# Patient Record
Sex: Male | Born: 1995 | Race: Black or African American | Hispanic: No | Marital: Single | Smoking: Never smoker
Health system: Southern US, Community
[De-identification: ages and names within clinical notes are randomized; demographics above are authoritative.]

---
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
## 2015-11-08 ENCOUNTER — Encounter (HOSPITAL_COMMUNITY): Payer: Self-pay | Admitting: Emergency Medicine

## 2015-11-08 DIAGNOSIS — J039 Acute tonsillitis, unspecified: Principal | ICD-10-CM | POA: Diagnosis present

## 2015-11-08 NOTE — ED Notes (Signed)
Patient with URI and sore throat.  Patient states that he was prescribed Penicillen and viscous lidocaine for his sore throat.  He states that he has not been able to sleep, having a hard time breathing due to the congestion.

## 2015-11-09 ENCOUNTER — Emergency Department (HOSPITAL_COMMUNITY): Payer: Commercial Managed Care - PPO

## 2015-11-09 ENCOUNTER — Encounter (HOSPITAL_COMMUNITY): Payer: Self-pay | Admitting: Radiology

## 2015-11-09 ENCOUNTER — Inpatient Hospital Stay (HOSPITAL_COMMUNITY)
Admission: EM | Admit: 2015-11-09 | Discharge: 2015-11-10 | DRG: 153 | Disposition: A | Discharge status: Home / Self Care | Payer: Commercial Managed Care - PPO | Attending: Internal Medicine | Admitting: Internal Medicine

## 2015-11-09 DIAGNOSIS — J029 Acute pharyngitis, unspecified: Secondary | ICD-10-CM

## 2015-11-09 DIAGNOSIS — J3502 Chronic adenoiditis: Secondary | ICD-10-CM

## 2015-11-09 DIAGNOSIS — J039 Acute tonsillitis, unspecified: Secondary | ICD-10-CM | POA: Diagnosis present

## 2015-11-09 LAB — CBC WITH DIFFERENTIAL/PLATELET
BASOS ABS: 0.3 10*3/uL — AB (ref 0.0–0.1)
BASOS PCT: 3 %
EOS PCT: 0 %
Eosinophils Absolute: 0 10*3/uL (ref 0.0–0.7)
HEMATOCRIT: 41.6 % (ref 39.0–52.0)
HEMOGLOBIN: 13.8 g/dL (ref 13.0–17.0)
LYMPHS PCT: 63 %
Lymphs Abs: 7.3 10*3/uL — ABNORMAL HIGH (ref 0.7–4.0)
MCH: 26.1 pg (ref 26.0–34.0)
MCHC: 33.2 g/dL (ref 30.0–36.0)
MCV: 78.8 fL (ref 78.0–100.0)
MONOS PCT: 11 %
Monocytes Absolute: 1.3 10*3/uL — ABNORMAL HIGH (ref 0.1–1.0)
NEUTROS ABS: 2.7 10*3/uL (ref 1.7–7.7)
Neutrophils Relative %: 23 %
Platelets: 155 10*3/uL (ref 150–400)
RBC: 5.28 MIL/uL (ref 4.22–5.81)
RDW: 13.4 % (ref 11.5–15.5)
WBC: 11.6 10*3/uL — ABNORMAL HIGH (ref 4.0–10.5)

## 2015-11-09 LAB — I-STAT CHEM 8, ED
BUN: 25 mg/dL — AB (ref 6–20)
CALCIUM ION: 1.09 mmol/L — AB (ref 1.12–1.23)
CHLORIDE: 99 mmol/L — AB (ref 101–111)
CREATININE: 0.7 mg/dL (ref 0.61–1.24)
Glucose, Bld: 110 mg/dL — ABNORMAL HIGH (ref 65–99)
HCT: 47 % (ref 39.0–52.0)
Hemoglobin: 16 g/dL (ref 13.0–17.0)
Potassium: 4.6 mmol/L (ref 3.5–5.1)
SODIUM: 135 mmol/L (ref 135–145)
TCO2: 28 mmol/L (ref 0–100)

## 2015-11-09 LAB — RAPID STREP SCREEN (MED CTR MEBANE ONLY): STREPTOCOCCUS, GROUP A SCREEN (DIRECT): NEGATIVE

## 2015-11-09 LAB — MONONUCLEOSIS SCREEN: MONO SCREEN: POSITIVE — AB

## 2015-11-09 MED ORDER — INFLUENZA VAC SPLIT QUAD 0.5 ML IM SUSY: 0.5000 mL | PREFILLED_SYRINGE | INTRAMUSCULAR | Status: DC | PRN

## 2015-11-09 MED ORDER — OXYCODONE HCL 5 MG PO TABS
5.0000 mg | ORAL_TABLET | Freq: Four times a day (QID) | ORAL | Status: DC | PRN
  Administered 2015-11-09 – 2015-11-10 (×3): 5 mg via ORAL
  Filled 2015-11-09 (×4): qty 1

## 2015-11-09 MED ORDER — BENZONATATE 100 MG PO CAPS
200.0000 mg | ORAL_CAPSULE | Freq: Once | ORAL | Status: AC
  Administered 2015-11-09: 200 mg via ORAL
  Filled 2015-11-09: qty 2

## 2015-11-09 MED ORDER — LIDOCAINE VISCOUS 2 % MT SOLN
20.0000 mL | OROMUCOSAL | Status: DC | PRN
  Administered 2015-11-09 – 2015-11-10 (×2): 20 mL via OROMUCOSAL
  Filled 2015-11-09 (×2): qty 30

## 2015-11-09 MED ORDER — ACETAMINOPHEN 325 MG PO TABS
650.0000 mg | ORAL_TABLET | Freq: Four times a day (QID) | ORAL | Status: DC | PRN
  Administered 2015-11-10: 650 mg via ORAL
  Filled 2015-11-09: qty 2

## 2015-11-09 MED ORDER — IPRATROPIUM-ALBUTEROL 0.5-2.5 (3) MG/3ML IN SOLN
3.0000 mL | RESPIRATORY_TRACT | Status: DC | PRN
  Administered 2015-11-09 – 2015-11-10 (×2): 3 mL via RESPIRATORY_TRACT
  Filled 2015-11-09 (×2): qty 3

## 2015-11-09 MED ORDER — ACETAMINOPHEN 650 MG RE SUPP: 650.0000 mg | Freq: Four times a day (QID) | RECTAL | Status: DC | PRN

## 2015-11-09 MED ORDER — KETOROLAC TROMETHAMINE 30 MG/ML IJ SOLN
30.0000 mg | Freq: Once | INTRAMUSCULAR | Status: AC
  Administered 2015-11-09: 30 mg via INTRAVENOUS
  Filled 2015-11-09: qty 1

## 2015-11-09 MED ORDER — SODIUM CHLORIDE 0.9 % IV SOLN
INTRAVENOUS | Status: DC
  Administered 2015-11-09: 06:00:00 via INTRAVENOUS

## 2015-11-09 MED ORDER — IOHEXOL 300 MG/ML  SOLN
75.0000 mL | Freq: Once | INTRAMUSCULAR | Status: AC | PRN
  Administered 2015-11-09: 50 mL via INTRAVENOUS

## 2015-11-09 MED ORDER — MORPHINE SULFATE (PF) 2 MG/ML IV SOLN: 2.0000 mg | INTRAVENOUS | Status: DC | PRN

## 2015-11-09 MED ORDER — AMPICILLIN-SULBACTAM SODIUM 1.5 (1-0.5) G IJ SOLR
1.5000 g | Freq: Four times a day (QID) | INTRAMUSCULAR | Status: DC
  Administered 2015-11-09 – 2015-11-10 (×5): 1.5 g via INTRAVENOUS
  Filled 2015-11-09 (×8): qty 1.5

## 2015-11-09 MED ORDER — BENZOCAINE 10 % MT GEL: Freq: Four times a day (QID) | OROMUCOSAL | Status: DC | PRN

## 2015-11-09 MED ORDER — SODIUM CHLORIDE 0.9 % IV BOLUS (SEPSIS)
1000.0000 mL | Freq: Once | INTRAVENOUS | Status: AC
  Administered 2015-11-09: 1000 mL via INTRAVENOUS

## 2015-11-09 MED ORDER — DEXAMETHASONE SODIUM PHOSPHATE 10 MG/ML IJ SOLN
10.0000 mg | Freq: Once | INTRAMUSCULAR | Status: AC
  Administered 2015-11-09: 10 mg via INTRAVENOUS
  Filled 2015-11-09: qty 1

## 2015-11-09 MED ORDER — SODIUM CHLORIDE 0.9 % IV SOLN
3.0000 g | Freq: Once | INTRAVENOUS | Status: AC
  Administered 2015-11-09: 3 g via INTRAVENOUS
  Filled 2015-11-09: qty 3

## 2015-11-09 MED ORDER — IPRATROPIUM-ALBUTEROL 0.5-2.5 (3) MG/3ML IN SOLN
3.0000 mL | RESPIRATORY_TRACT | Status: DC
  Administered 2015-11-09 (×4): 3 mL via RESPIRATORY_TRACT
  Filled 2015-11-09 (×4): qty 3

## 2015-11-09 MED ORDER — ENOXAPARIN SODIUM 40 MG/0.4ML ~~LOC~~ SOLN
40.0000 mg | SUBCUTANEOUS | Status: DC
  Administered 2015-11-09: 40 mg via SUBCUTANEOUS
  Filled 2015-11-09: qty 0.4

## 2015-11-09 NOTE — ED Provider Notes (Signed)
CSN: [id number]     Arrival date & time 11/08/15  2241 History  By signing my name below, I, Linna Darner, attest that this documentation has been prepared under the direction and in the presence of physician practitioner, Tomasita Crumble, MD. Electronically Signed: Linna Darner, Scribe. 11/09/2015. 2:25 AM.    Chief Complaint  Patient presents with  . Sore Throat    The history is provided by the patient and a parent. No language interpreter was used.     HPI Comments: Jeremiah Reynolds is a 20 y.o. male with no pertinent PMHx who presents to the Emergency Department complaining of sudden onset, constant, bilateral sore throat for the last week. He endorses associated SOB and trouble/pain swallowing as well. He notes that he has not been around anyone with a sore throat recently. He states that his throat feels like it is "jumping." He notes that his throat feels like it is tightening up. Pt was recently prescribed penicillin and lidocaine for his sore throat; he has also taken NyQuil for his symptoms with no relief. He also notes that he has had difficulty sleeping for the past couple of days due to difficulty breathing. He denies cough, voice change, fever, or any other associated symptoms.  History reviewed. No pertinent past medical history. History reviewed. No pertinent past surgical history. No family history on file. Social History  Substance Use Topics  . Smoking status: Never Smoker   . Smokeless tobacco: None  . Alcohol Use: None    Review of Systems  A complete 10 system review of systems was obtained and all systems are negative except as noted in the HPI and PMH.   Allergies  Review of patient's allergies indicates no known allergies.  Home Medications   Prior to Admission medications   Medication Sig Start Date End Date Taking? Authorizing Provider  lidocaine (XYLOCAINE) 2 % solution Use as directed 20 mLs in the mouth or throat as needed for mouth pain.   Yes  Historical Provider, MD  penicillin v potassium (VEETID) 500 MG tablet Take 500 mg by mouth 2 (two) times daily.   Yes Historical Provider, MD  Phenyleph-Doxylamine-DM-APAP (NYQUIL SEVERE COLD/FLU) 5-6.25-10-325 MG/15ML LIQD Take 15 mLs by mouth as needed (for cough).   Yes Historical Provider, MD   BP 131/85 mmHg  Pulse 69  Temp(Src) 99.8 F (37.7 C) (Oral)  Resp 18  SpO2 98% Physical Exam  Constitutional: He is oriented to person, place, and time. Vital signs are normal. He appears well-developed and well-nourished.  Non-toxic appearance. He does not appear ill. No distress.  HENT:  Head: Normocephalic and atraumatic.  Nose: Nose normal.  Mouth/Throat: Oropharynx is clear and moist. No oropharyngeal exudate.  Limited view of oropharynx but no swelling noted Patient sounds congested  Eyes: Conjunctivae and EOM are normal. Pupils are equal, round, and reactive to light. No scleral icterus.  Neck: Normal range of motion. Neck supple. No tracheal deviation, no edema, no erythema and normal range of motion present. No thyroid mass and no thyromegaly present.  Cardiovascular: Normal rate, regular rhythm, S1 normal, S2 normal, normal heart sounds, intact distal pulses and normal pulses.  Exam reveals no gallop and no friction rub.   No murmur heard. Pulmonary/Chest: Effort normal and breath sounds normal. No respiratory distress. He has no wheezes. He has no rhonchi. He has no rales.  Abdominal: Soft. Normal appearance and bowel sounds are normal. He exhibits no distension, no ascites and no mass. There is no  hepatosplenomegaly. There is no tenderness. There is no rebound, no guarding and no CVA tenderness.  Musculoskeletal: Normal range of motion. He exhibits no edema or tenderness.  Lymphadenopathy:    He has no cervical adenopathy.  Neurological: He is alert and oriented to person, place, and time. He has normal strength. No cranial nerve deficit or sensory deficit.  Skin: Skin is warm, dry  and intact. No petechiae and no rash noted. He is not diaphoretic. No erythema. No pallor.  Psychiatric: He has a normal mood and affect. His behavior is normal. Judgment normal.  Nursing note and vitals reviewed.   ED Course  Procedures (including critical care time)  DIAGNOSTIC STUDIES: Oxygen Saturation is 98% on RA, normal by my interpretation.    COORDINATION OF CARE: 2:25 AM Will administer nebulizer, fluids, Tessalon, Unasyn, and Toradol injection. Will order CT Soft Tissue Neck w/ Contrast. Will order blood work. Discussed treatment plan with pt at bedside and pt agreed to plan.  Labs Review Labs Reviewed  CBC WITH DIFFERENTIAL/PLATELET - Abnormal; Notable for the following:    WBC 11.6 (*)    All other components within normal limits  I-STAT CHEM 8, ED - Abnormal; Notable for the following:    Chloride 99 (*)    BUN 25 (*)    Glucose, Bld 110 (*)    Calcium, Ion 1.09 (*)    All other components within normal limits    Imaging Review Ct Soft Tissue Neck W Contrast  11/09/2015  ADDENDUM REPORT: 11/09/2015 05:04 ADDENDUM: Correction: Lymph nodes: Lymphadenopathy, including LEFT level 2a 15 mm lymph node, bilateral level IIa lymphadenopathy measuring up to 14 mm on the RIGHT. Electronically Signed   By: Awilda Metroourtnay  Bloomer M.D.   On: 11/09/2015 05:04  11/09/2015  CLINICAL DATA:  Acute onset sore throat for 1 week, shortness of breath and difficulty swallowing. On penicillin. EXAM: CT NECK WITH CONTRAST TECHNIQUE: Multidetector CT imaging of the neck was performed using the standard protocol following the bolus administration of intravenous contrast. CONTRAST:  50mL OMNIPAQUE IOHEXOL 300 MG/ML  SOLN COMPARISON:  None. FINDINGS: Pharynx and larynx: Markedly enlarged, symmetric edema of the adenoidal soft tissues with faint peripheral enhancement. Otherwise negative. Salivary glands: Normal. Thyroid: Normal. Lymph nodes: Lymphadenopathy, including LEFT level 2 day 15 mm lymph node,  bilateral level IIa lymphadenopathy measuring up to 14 mm on the RIGHT. Vascular: Normal. Limited intracranial: Normal. Visualized orbits: Normal. Mastoids and visualized paranasal sinuses: Minimal paranasal sinus mucosal thickening without air-fluid levels. The mastoid air cells are well aerated. Skeleton: No acute osseous process or destructive bony lesions. Upper chest: Lung apices are clear. No superior mediastinal lymphadenopathy. IMPRESSION: Severely edematous adenoids compatible with adenoiditis, suspected superimposed abscess. Patent airway. Lymphadenopathy is likely reactive. Electronically Signed: By: Awilda Metroourtnay  Bloomer M.D. On: 11/09/2015 04:43   I have personally reviewed and evaluated these images and lab results as part of my medical decision-making.   EKG Interpretation None      MDM   Final diagnoses:  None    Patient presents to the ED for sore throat.  He is having mild trismus and sounds like he is not completely tolerating his secretions.  CT neck is warranted to eval for abscess.  He was given decadron, toradol ad unasyn for treatment.    Dr. Jenne PaneBates was consulted for CT findings which shows adenitis and possible abscess. He recommends to admit to the hospitalist for further care. I gave the patient Unasyn for treatment. He will consult on  the patient as well.    I personally performed the services described in this documentation, which was scribed in my presence. The recorded information has been reviewed and is accurate.       Tomasita Crumble, MD 11/09/15 [phone]

## 2015-11-09 NOTE — Progress Notes (Signed)
PATIENT DETAILS Name: Jeremiah Reynolds Age: 20 y.o. Sex: male Date of Birth: 04/11/1996 Admit Date: 11/09/2015 Admitting Physician Hillary BowJared M Gardner, DO PCP:No primary care provider on file.  Subjective: Much better, less pain during swallowing.  Assessment/Plan: Principal Problem:  Acute adenoiditis:Anything improved-continue Unasyn, mono screen positive. Await HIV and strep cultures. Seen by ENT,not felt to have a drainable abscess, recommendations are to discharge on Augmentin to complete a 10 day course of antibiotics.  Disposition: Remain inpatient-home in 1-2 days  Antimicrobial agents  See below  Anti-infectives    Start     Dose/Rate Route Frequency Ordered Stop   11/09/15 1000  ampicillin-sulbactam (UNASYN) 1.5 g in sodium chloride 0.9 % 50 mL IVPB     1.5 g 100 mL/hr over 30 Minutes Intravenous Every 6 hours 11/09/15 0519     11/09/15 0300  Ampicillin-Sulbactam (UNASYN) 3 g in sodium chloride 0.9 % 100 mL IVPB     3 g 100 mL/hr over 60 Minutes Intravenous  Once 11/09/15 0250 11/09/15 0504      DVT Prophylaxis: Prophylactic Lovenox  Code Status: Full code   Family Communication None at bedside  Procedures: None  CONSULTS:  ENT  Time spent 20  minutes-Greater than 50% of this time was spent in counseling, explanation of diagnosis, planning of further management, and coordination of care.  MEDICATIONS: Scheduled Meds: . ampicillin-sulbactam (UNASYN) IV  1.5 g Intravenous Q6H  . enoxaparin (LOVENOX) injection  40 mg Subcutaneous Q24H  . ipratropium-albuterol  3 mL Nebulization Q4H   Continuous Infusions: . sodium chloride 100 mL/hr at 11/09/15 0611   PRN Meds:.acetaminophen **OR** acetaminophen, Influenza vac split quadrivalent PF, lidocaine, oxyCODONE    PHYSICAL EXAM: Vital signs in last 24 hours: Filed Vitals:   11/09/15 0542 11/09/15 0600 11/09/15 1008 11/09/15 1226  BP:  108/73    Pulse:  71 91 69  Temp: 98.8 F (37.1  C) 98.4 F (36.9 C)    TempSrc: Oral Oral    Resp:  16 20 20   Height:  5\' 7"  (1.702 m)    Weight:  50.621 kg (111 lb 9.6 oz)    SpO2:  100% 98% 97%    Weight change:  Filed Weights   11/09/15 0600  Weight: 50.621 kg (111 lb 9.6 oz)   Body mass index is 17.47 kg/(m^2).   Gen Exam: Awake and alert with clear speech.  Neck: Supple, No JVD.   Chest: B/L Clear.   CVS: S1 S2 Regular, no murmurs.  Abdomen: soft, BS +, non tender, non distended.  Extremities: no edema, lower extremities warm to touch. Neurologic: Non Focal.   Skin: No Rash.   Wounds: N/A.   Intake/Output from previous day:  Intake/Output Summary (Last 24 hours) at 11/09/15 1402 Last data filed at 11/09/15 1138  Gross per 24 hour  Intake    238 ml  Output    700 ml  Net   -462 ml     LAB RESULTS: CBC  Recent Labs Lab 11/09/15 0335 11/09/15 0402  WBC 11.6*  --   HGB 13.8 16.0  HCT 41.6 47.0  PLT 155  --   MCV 78.8  --   MCH 26.1  --   MCHC 33.2  --   RDW 13.4  --   LYMPHSABS 7.3*  --   MONOABS 1.3*  --   EOSABS 0.0  --   BASOSABS 0.3*  --  Chemistries   Recent Labs Lab 11/09/15 0402  NA 135  K 4.6  CL 99*  GLUCOSE 110*  BUN 25*  CREATININE 0.70    CBG: No results for input(s): GLUCAP in the last 168 hours.  GFR Estimated Creatinine Clearance: 106.3 mL/min (by C-G formula based on Cr of 0.7).  Coagulation profile No results for input(s): INR, PROTIME in the last 168 hours.  Cardiac Enzymes No results for input(s): CKMB, TROPONINI, MYOGLOBIN in the last 168 hours.  Invalid input(s): CK  Invalid input(s): POCBNP No results for input(s): DDIMER in the last 72 hours. No results for input(s): HGBA1C in the last 72 hours. No results for input(s): CHOL, HDL, LDLCALC, TRIG, CHOLHDL, LDLDIRECT in the last 72 hours. No results for input(s): TSH, T4TOTAL, T3FREE, THYROIDAB in the last 72 hours.  Invalid input(s): FREET3 No results for input(s): VITAMINB12, FOLATE, FERRITIN,  TIBC, IRON, RETICCTPCT in the last 72 hours. No results for input(s): LIPASE, AMYLASE in the last 72 hours.  Urine Studies No results for input(s): UHGB, CRYS in the last 72 hours.  Invalid input(s): UACOL, UAPR, USPG, UPH, UTP, UGL, UKET, UBIL, UNIT, UROB, ULEU, UEPI, UWBC, URBC, UBAC, CAST, UCOM, BILUA  MICROBIOLOGY: No results found for this or any previous visit (from the past 240 hour(s)).  RADIOLOGY STUDIES/RESULTS: Ct Soft Tissue Neck W Contrast  11/09/2015  ADDENDUM REPORT: 11/09/2015 05:04 ADDENDUM: Correction: Lymph nodes: Lymphadenopathy, including LEFT level 2a 15 mm lymph node, bilateral level IIa lymphadenopathy measuring up to 14 mm on the RIGHT. Electronically Signed   By: Awilda Metro M.D.   On: 11/09/2015 05:04  11/09/2015  CLINICAL DATA:  Acute onset sore throat for 1 week, shortness of breath and difficulty swallowing. On penicillin. EXAM: CT NECK WITH CONTRAST TECHNIQUE: Multidetector CT imaging of the neck was performed using the standard protocol following the bolus administration of intravenous contrast. CONTRAST:  50mL OMNIPAQUE IOHEXOL 300 MG/ML  SOLN COMPARISON:  None. FINDINGS: Pharynx and larynx: Markedly enlarged, symmetric edema of the adenoidal soft tissues with faint peripheral enhancement. Otherwise negative. Salivary glands: Normal. Thyroid: Normal. Lymph nodes: Lymphadenopathy, including LEFT level 2 day 15 mm lymph node, bilateral level IIa lymphadenopathy measuring up to 14 mm on the RIGHT. Vascular: Normal. Limited intracranial: Normal. Visualized orbits: Normal. Mastoids and visualized paranasal sinuses: Minimal paranasal sinus mucosal thickening without air-fluid levels. The mastoid air cells are well aerated. Skeleton: No acute osseous process or destructive bony lesions. Upper chest: Lung apices are clear. No superior mediastinal lymphadenopathy. IMPRESSION: Severely edematous adenoids compatible with adenoiditis, suspected superimposed abscess. Patent  airway. Lymphadenopathy is likely reactive. Electronically Signed: By: Awilda Metro M.D. On: 11/09/2015 04:43    Jeoffrey Massed, MD  [facility] Pager:[phone]  If 7PM-7AM, please contact night-coverage [email or web address] Password [email or web address] 11/09/2015, 2:02 PM   LOS: 0 days

## 2015-11-09 NOTE — Progress Notes (Signed)
Placed patient on 28% Aerosol face mask for humidity and comfort. Pt tol well. Will cont to monitor

## 2015-11-09 NOTE — Progress Notes (Signed)
Pharmacy Antibiotic Note  Jeremiah Reynolds is a 20 y.o. male admitted on 11/09/2015 with adenoiditis.  Pharmacy has been consulted for Unasyn dosing.  Plan: Unasyn 1.5g IV Q6H.  Temp (24hrs), Avg:99.7 F (37.6 C), Min:99.6 F (37.6 C), Max:99.8 F (37.7 C)   Recent Labs Lab 11/09/15 0335 11/09/15 0402  WBC 11.6*  --   CREATININE  --  0.70      No Known Allergies    Thank you for allowing pharmacy to be a part of this patient's care.  Jeremiah GamblesVeronda Hayven Reynolds, PharmD, BCPS  11/09/2015 5:17 AM

## 2015-11-09 NOTE — H&P (Signed)
[facility] History and Physical  Jeremiah Reynolds ZOX:[id number] DOB: 07-29-1996 DOA: 11/09/2015  Referring physician: EDP PCP: No primary care provider on file.   Chief Complaint: Sore throat   HPI: Jeremiah Reynolds is a 20 y.o. male previously healthy, patient presents to the ED with constant bilateral sore throat for the past week.  Associated pain and difficulty with swallowing.  Recently prescribed penicillin and lidocaine for sore throat.  Difficulty sleeping for past few days due to difficulty with secretions.  No cough, voice change, fever, or other associated symptoms.  Review of Systems: Systems reviewed.  As above, otherwise negative  History reviewed. No pertinent past medical history. History reviewed. No pertinent past surgical history. Social History:  reports that he has never smoked. He does not have any smokeless tobacco history on file. His alcohol and drug histories are not on file.  No Known Allergies  No family history on file.   Prior to Admission medications   Medication Sig Start Date End Date Taking? Authorizing Provider  lidocaine (XYLOCAINE) 2 % solution Use as directed 20 mLs in the mouth or throat as needed for mouth pain.   Yes Historical Provider, MD  penicillin v potassium (VEETID) 500 MG tablet Take 500 mg by mouth 2 (two) times daily.   Yes Historical Provider, MD  Phenyleph-Doxylamine-DM-APAP (NYQUIL SEVERE COLD/FLU) 5-6.25-10-325 MG/15ML LIQD Take 15 mLs by mouth as needed (for cough).   Yes Historical Provider, MD   Physical Exam: Filed Vitals:   11/09/15 0147 11/09/15 0149  BP: 131/85   Pulse: 69   Temp:  99.8 F (37.7 C)  Resp: 18     BP 131/85 mmHg  Pulse 69  Temp(Src) 99.8 F (37.7 C) (Oral)  Resp 18  SpO2 98%  General Appearance:    Alert, oriented, no distress, appears stated age  Head:    Normocephalic, atraumatic  Eyes:    PERRL, EOMI, sclera non-icteric        Nose:   Nares without drainage or epistaxis. Mucosa,  turbinates normal  Throat:  Limited view of oropharynx.  Neck:   Supple. No carotid bruits.  No thyromegaly.  No lymphadenopathy.   Back:     No CVA tenderness, no spinal tenderness  Lungs:     Clear to auscultation bilaterally, without wheezes, rhonchi or rales  Chest wall:    No tenderness to palpitation  Heart:    Regular rate and rhythm without murmurs, gallops, rubs  Abdomen:     Soft, non-tender, nondistended, normal bowel sounds, no organomegaly  Genitalia:    deferred  Rectal:    deferred  Extremities:   No clubbing, cyanosis or edema.  Pulses:   2+ and symmetric all extremities  Skin:   Skin color, texture, turgor normal, no rashes or lesions  Lymph nodes:   Cervical, supraclavicular, and axillary nodes normal  Neurologic:   CNII-XII intact. Normal strength, sensation and reflexes      throughout    Labs on Admission:  Basic Metabolic Panel:  Recent Labs Lab 11/09/15 0402  NA 135  K 4.6  CL 99*  GLUCOSE 110*  BUN 25*  CREATININE 0.70   Liver Function Tests: No results for input(s): AST, ALT, ALKPHOS, BILITOT, PROT, ALBUMIN in the last 168 hours. No results for input(s): LIPASE, AMYLASE in the last 168 hours. No results for input(s): AMMONIA in the last 168 hours. CBC:  Recent Labs Lab 11/09/15 0335 11/09/15 0402  WBC 11.6*  --   NEUTROABS  PENDING  --   HGB 13.8 16.0  HCT 41.6 47.0  MCV 78.8  --   PLT 155  --    Cardiac Enzymes: No results for input(s): CKTOTAL, CKMB, CKMBINDEX, TROPONINI in the last 168 hours.  BNP (last 3 results) No results for input(s): PROBNP in the last 8760 hours. CBG: No results for input(s): GLUCAP in the last 168 hours.  Radiological Exams on Admission: Ct Soft Tissue Neck W Contrast  11/09/2015  ADDENDUM REPORT: 11/09/2015 05:04 ADDENDUM: Correction: Lymph nodes: Lymphadenopathy, including LEFT level 2a 15 mm lymph node, bilateral level IIa lymphadenopathy measuring up to 14 mm on the RIGHT. Electronically Signed   By:  Awilda Metroourtnay  Bloomer M.D.   On: 11/09/2015 05:04  11/09/2015  CLINICAL DATA:  Acute onset sore throat for 1 week, shortness of breath and difficulty swallowing. On penicillin. EXAM: CT NECK WITH CONTRAST TECHNIQUE: Multidetector CT imaging of the neck was performed using the standard protocol following the bolus administration of intravenous contrast. CONTRAST:  50mL OMNIPAQUE IOHEXOL 300 MG/ML  SOLN COMPARISON:  None. FINDINGS: Pharynx and larynx: Markedly enlarged, symmetric edema of the adenoidal soft tissues with faint peripheral enhancement. Otherwise negative. Salivary glands: Normal. Thyroid: Normal. Lymph nodes: Lymphadenopathy, including LEFT level 2 day 15 mm lymph node, bilateral level IIa lymphadenopathy measuring up to 14 mm on the RIGHT. Vascular: Normal. Limited intracranial: Normal. Visualized orbits: Normal. Mastoids and visualized paranasal sinuses: Minimal paranasal sinus mucosal thickening without air-fluid levels. The mastoid air cells are well aerated. Skeleton: No acute osseous process or destructive bony lesions. Upper chest: Lung apices are clear. No superior mediastinal lymphadenopathy. IMPRESSION: Severely edematous adenoids compatible with adenoiditis, suspected superimposed abscess. Patent airway. Lymphadenopathy is likely reactive. Electronically Signed: By: Awilda Metroourtnay  Bloomer M.D. On: 11/09/2015 04:43    EKG: Independently reviewed.  Assessment/Plan Principal Problem:   Acute adenoiditis   1. Acute adenoiditis - 1. CT findings of adenoiditis and possible small 1.4cm abscess. 2. Unasyn 3. IVF 4. Consult with ENT (see below)  Dr. Jenne PaneBates was called by EDP, he says not surgical due to very small size of abscess, admit to medicine and treat with IV abx, he will consult per EDP.  Code Status: Full  Family Communication: No family in room Disposition Plan: Admit to inpatient   Time spent: 50 min  Constance Whittle M. [facility] Pager [phone]  If 7AM-7PM, please  contact the day team taking care of the patient [email or web address] Password [email or web address] 11/09/2015, 5:28 AM

## 2015-11-09 NOTE — Consult Note (Signed)
Reason for Consult:Adenoiditis Referring Physician: Medicine  Jeremiah Reynolds is an 20 y.o. male.  HPI: 20 year old male developed sore throat early last week.  He saw his doctor where strep testing was negative.  He was treated with penicillin and lidocaine but symptoms worsened.  Last night, he was having difficulty swallowing and opening his mouth widely so he came to the ER where a neck CT scan demonstrated adenoid infection.  He was admitted and treated with IV Unasyn.  Now, he can tell pain is somewhat improved.  History reviewed. No pertinent past medical history.  History reviewed. No pertinent past surgical history.  No family history on file.  Social History:  reports that he has never smoked. He does not have any smokeless tobacco history on file. His alcohol and drug histories are not on file.  Allergies: No Known Allergies  Medications: I have reviewed the patient's current medications.  Results for orders placed or performed during the hospital encounter of 11/09/15 (from the past 48 hour(s))  CBC with Differential/Platelet     Status: Abnormal   Collection Time: 11/09/15  3:35 AM  Result Value Ref Range   WBC 11.6 (H) 4.0 - 10.5 K/uL   RBC 5.28 4.22 - 5.81 MIL/uL   Hemoglobin 13.8 13.0 - 17.0 g/dL   HCT 11.941.6 14.739.0 - 82.952.0 %   MCV 78.8 78.0 - 100.0 fL   MCH 26.1 26.0 - 34.0 pg   MCHC 33.2 30.0 - 36.0 g/dL   RDW 56.213.4 13.011.5 - 86.515.5 %   Platelets 155 150 - 400 K/uL   Neutrophils Relative % 23 %   Lymphocytes Relative 63 %   Monocytes Relative 11 %   Eosinophils Relative 0 %   Basophils Relative 3 %   Neutro Abs 2.7 1.7 - 7.7 K/uL   Lymphs Abs 7.3 (H) 0.7 - 4.0 K/uL   Monocytes Absolute 1.3 (H) 0.1 - 1.0 K/uL   Eosinophils Absolute 0.0 0.0 - 0.7 K/uL   Basophils Absolute 0.3 (H) 0.0 - 0.1 K/uL   RBC Morphology TEARDROP CELLS    WBC Morphology ATYPICAL LYMPHOCYTES   I-stat chem 8, ed     Status: Abnormal   Collection Time: 11/09/15  4:02 AM  Result Value Ref Range    Sodium 135 135 - 145 mmol/L   Potassium 4.6 3.5 - 5.1 mmol/L   Chloride 99 (L) 101 - 111 mmol/L   BUN 25 (H) 6 - 20 mg/dL   Creatinine, Ser 7.840.70 0.61 - 1.24 mg/dL   Glucose, Bld 696110 (H) 65 - 99 mg/dL   Calcium, Ion 2.951.09 (L) 1.12 - 1.23 mmol/L   TCO2 28 0 - 100 mmol/L   Hemoglobin 16.0 13.0 - 17.0 g/dL   HCT 28.447.0 13.239.0 - 44.052.0 %  Mononucleosis screen     Status: Abnormal   Collection Time: 11/09/15 10:44 AM  Result Value Ref Range   Mono Screen POSITIVE (A) NEGATIVE    Ct Soft Tissue Neck W Contrast  11/09/2015  ADDENDUM REPORT: 11/09/2015 05:04 ADDENDUM: Correction: Lymph nodes: Lymphadenopathy, including LEFT level 2a 15 mm lymph node, bilateral level IIa lymphadenopathy measuring up to 14 mm on the RIGHT. Electronically Signed   By: Awilda Metroourtnay  Bloomer M.D.   On: 11/09/2015 05:04  11/09/2015  CLINICAL DATA:  Acute onset sore throat for 1 week, shortness of breath and difficulty swallowing. On penicillin. EXAM: CT NECK WITH CONTRAST TECHNIQUE: Multidetector CT imaging of the neck was performed using the standard protocol following  the bolus administration of intravenous contrast. CONTRAST:  50mL OMNIPAQUE IOHEXOL 300 MG/ML  SOLN COMPARISON:  None. FINDINGS: Pharynx and larynx: Markedly enlarged, symmetric edema of the adenoidal soft tissues with faint peripheral enhancement. Otherwise negative. Salivary glands: Normal. Thyroid: Normal. Lymph nodes: Lymphadenopathy, including LEFT level 2 day 15 mm lymph node, bilateral level IIa lymphadenopathy measuring up to 14 mm on the RIGHT. Vascular: Normal. Limited intracranial: Normal. Visualized orbits: Normal. Mastoids and visualized paranasal sinuses: Minimal paranasal sinus mucosal thickening without air-fluid levels. The mastoid air cells are well aerated. Skeleton: No acute osseous process or destructive bony lesions. Upper chest: Lung apices are clear. No superior mediastinal lymphadenopathy. IMPRESSION: Severely edematous adenoids compatible with  adenoiditis, suspected superimposed abscess. Patent airway. Lymphadenopathy is likely reactive. Electronically Signed: By: Awilda Metro M.D. On: 11/09/2015 04:43    Review of Systems  Constitutional: Positive for fever.  HENT: Positive for congestion and sore throat.   All other systems reviewed and are negative.  Blood pressure 108/73, pulse 69, temperature 98.4 F (36.9 C), temperature source Oral, resp. rate 20, height  (1.702 m), weight 50.621 kg (111 lb 9.6 oz), SpO2 97 %. Physical Exam  Constitutional: He is oriented to person, place, and time. He appears well-developed and well-nourished. No distress.  HENT:  Head: Normocephalic and atraumatic.  Right Ear: External ear normal.  Left Ear: External ear normal.  Nose: Nose normal.  Mouth/Throat: Oropharynx is clear and moist.  Eyes: Conjunctivae and EOM are normal. Pupils are equal, round, and reactive to light.  Neck: Normal range of motion. Neck supple.  Cardiovascular: Normal rate.   Respiratory: Effort normal.  Musculoskeletal: Normal range of motion.  Neurological: He is alert and oriented to person, place, and time. No cranial nerve deficit.  Skin: Skin is warm and dry.  Psychiatric: He has a normal mood and affect. His behavior is normal. Judgment and thought content normal.    Assessment/Plan: Acute adenoiditits I personally reviewed his neck CT scan.  The areas concerning for fluid collections in the adenoid are probably representative of edema, as opposed to abscesses.  I do not see a need for surgical intervention.  He is already beginning to improve on IV Unasyn.  I agree with plan for one more day of IV therapy before discharging on oral Augmentin for 10 days.  Tonna Palazzi 11/09/2015, 1:19 PM

## 2015-11-10 LAB — PATHOLOGIST SMEAR REVIEW: Path Review: REACTIVE

## 2015-11-10 LAB — HIV ANTIBODY (ROUTINE TESTING W REFLEX): HIV SCREEN 4TH GENERATION: NONREACTIVE

## 2015-11-10 MED ORDER — MENTHOL 3 MG MT LOZG: 1.0000 | LOZENGE | OROMUCOSAL | Status: AC | PRN

## 2015-11-10 MED ORDER — MENTHOL 3 MG MT LOZG: 1.0000 | LOZENGE | OROMUCOSAL | Status: DC | PRN

## 2015-11-10 MED ORDER — LIDOCAINE VISCOUS 2 % MT SOLN: 20.0000 mL | OROMUCOSAL | Status: AC | PRN

## 2015-11-10 MED ORDER — AMOXICILLIN-POT CLAVULANATE 875-125 MG PO TABS: 1.0000 | ORAL_TABLET | Freq: Two times a day (BID) | ORAL | Status: AC

## 2015-11-10 NOTE — Progress Notes (Signed)
Jeremiah Reynolds to be D/C'd Home per MD order.  Discussed with the patient and all questions fully answered.  VSS, Skin clean, dry and intact without evidence of skin break down, no evidence of skin tears noted. IV catheter discontinued intact. Site without signs and symptoms of complications. Dressing and pressure applied.  An After Visit Summary was printed and given to the patient. Patient received prescription.  D/c education completed with patient/family including follow up instructions, medication list, d/c activities limitations if indicated, with other d/c instructions as indicated by MD - patient able to verbalize understanding, all questions fully answered.   Patient instructed to return to ED, call 911, or call MD for any changes in condition.   Patient escorted via WC, and D/C home via private auto.  L'ESPERANCE, Milana Salay C 11/10/2015 11:26 AM

## 2015-11-10 NOTE — Discharge Summary (Signed)
PATIENT DETAILS Name: Jeremiah Reynolds Age: 20 y.o. Sex: male Date of Birth: 11/20/1995 MRN: [id number]. Admitting Physician: Hillary Bow, DO PCP:No primary care provider on file.  Admit Date: 11/09/2015 Discharge date: 11/10/2015  Recommendations for Outpatient Follow-up:  1. HIV/Strep cultures are still pending-please follow  PRIMARY DISCHARGE DIAGNOSIS:  Principal Problem:   Acute adenoiditis      PAST MEDICAL HISTORY: History reviewed. No pertinent past medical history.  DISCHARGE MEDICATIONS: Current Discharge Medication List    START taking these medications   Details  amoxicillin-clavulanate (AUGMENTIN) 875-125 MG tablet Take 1 tablet by mouth 2 (two) times daily. Qty: 16 tablet, Refills: 0    menthol-cetylpyridinium (CEPACOL) 3 MG lozenge Take 1 lozenge (3 mg total) by mouth as needed for sore throat. Qty: 30 tablet, Refills: 0      CONTINUE these medications which have CHANGED   Details  lidocaine (XYLOCAINE) 2 % solution Use as directed 20 mLs in the mouth or throat every 4 (four) hours as needed for mouth pain. Qty: 100 mL, Refills: 0      STOP taking these medications     penicillin v potassium (VEETID) 500 MG tablet      Phenyleph-Doxylamine-DM-APAP (NYQUIL SEVERE COLD/FLU) 5-6.25-10-325 MG/15ML LIQD         ALLERGIES:  No Known Allergies  BRIEF HPI:  See H&P, Labs, Consult and Test reports for all details in brief, patient is a 20 y.o. male previously healthy, patient presents to the ED with constant bilateral sore throat for the past week. Associated pain and difficulty with swallowing. He was admitted for further evaluation and treatment.  CONSULTATIONS:   ENT  PERTINENT RADIOLOGIC STUDIES: Ct Soft Tissue Neck W Contrast  11/09/2015  ADDENDUM REPORT: 11/09/2015 05:04 ADDENDUM: Correction: Lymph nodes: Lymphadenopathy, including LEFT level 2a 15 mm lymph node, bilateral level IIa lymphadenopathy measuring up to 14 mm on the RIGHT.  Electronically Signed   By: Awilda Metro M.D.   On: 11/09/2015 05:04  11/09/2015  CLINICAL DATA:  Acute onset sore throat for 1 week, shortness of breath and difficulty swallowing. On penicillin. EXAM: CT NECK WITH CONTRAST TECHNIQUE: Multidetector CT imaging of the neck was performed using the standard protocol following the bolus administration of intravenous contrast. CONTRAST:  50mL OMNIPAQUE IOHEXOL 300 MG/ML  SOLN COMPARISON:  None. FINDINGS: Pharynx and larynx: Markedly enlarged, symmetric edema of the adenoidal soft tissues with faint peripheral enhancement. Otherwise negative. Salivary glands: Normal. Thyroid: Normal. Lymph nodes: Lymphadenopathy, including LEFT level 2 day 15 mm lymph node, bilateral level IIa lymphadenopathy measuring up to 14 mm on the RIGHT. Vascular: Normal. Limited intracranial: Normal. Visualized orbits: Normal. Mastoids and visualized paranasal sinuses: Minimal paranasal sinus mucosal thickening without air-fluid levels. The mastoid air cells are well aerated. Skeleton: No acute osseous process or destructive bony lesions. Upper chest: Lung apices are clear. No superior mediastinal lymphadenopathy. IMPRESSION: Severely edematous adenoids compatible with adenoiditis, suspected superimposed abscess. Patent airway. Lymphadenopathy is likely reactive. Electronically Signed: By: Awilda Metro M.D. On: 11/09/2015 04:43     PERTINENT LAB RESULTS: CBC:  Recent Labs  11/09/15 0335 11/09/15 0402  WBC 11.6*  --   HGB 13.8 16.0  HCT 41.6 47.0  PLT 155  --    CMET CMP     Component Value Date/Time   NA 135 11/09/2015 0402   K 4.6 11/09/2015 0402   CL 99* 11/09/2015 0402   GLUCOSE 110* 11/09/2015 0402   BUN 25* 11/09/2015 0402  CREATININE 0.70 11/09/2015 0402    GFR Estimated Creatinine Clearance: 106.3 mL/min (by C-G formula based on Cr of 0.7). No results for input(s): LIPASE, AMYLASE in the last 72 hours. No results for input(s): CKTOTAL, CKMB,  CKMBINDEX, TROPONINI in the last 72 hours. Invalid input(s): POCBNP No results for input(s): DDIMER in the last 72 hours. No results for input(s): HGBA1C in the last 72 hours. No results for input(s): CHOL, HDL, LDLCALC, TRIG, CHOLHDL, LDLDIRECT in the last 72 hours. No results for input(s): TSH, T4TOTAL, T3FREE, THYROIDAB in the last 72 hours.  Invalid input(s): FREET3 No results for input(s): VITAMINB12, FOLATE, FERRITIN, TIBC, IRON, RETICCTPCT in the last 72 hours. Coags: No results for input(s): INR in the last 72 hours.  Invalid input(s): PT Microbiology: Recent Results (from the past 240 hour(s))  Rapid strep screen (not at [facility])     Status: None   Collection Time: 11/09/15  4:55 PM  Result Value Ref Range Status   Streptococcus, Group A Screen (Direct) NEGATIVE NEGATIVE Final    Comment: (NOTE) A Rapid Antigen test may result negative if the antigen level in the sample is below the detection level of this test. The FDA has not cleared this test as a stand-alone test therefore the rapid antigen negative result has reflexed to a Group A Strep culture.   Culture, group A strep     Status: None (Preliminary result)   Collection Time: 11/09/15  4:55 PM  Result Value Ref Range Status   Specimen Description THROAT  Final   Special Requests NONE Reflexed from [id number]  Final   Culture CULTURE REINCUBATED FOR BETTER GROWTH  Final   Report Status PENDING  Incomplete     BRIEF HOSPITAL COURSE:  Acute adenoiditis:Significantly mproved-started on Unasyn on admission. Throat pain is better, now able to easily tolerate soft food and full liquids. Seen by ENT-not thought to have a abscess. Recommendations from ENT are to treat with Abx for a total of 10 days-will transition to augmentin on discharge. Note- mono screen positive.HIV and strep cultures are still pending-both mother and patient know that this will need to be followed by PCP.   TODAY-DAY OF DISCHARGE:  Subjective:    Day Lahmann today has no headache,no chest abdominal pain,no new weakness tingling or numbness, feels much better wants to go home today.  Objective:   Blood pressure 123/76, pulse 66, temperature 98.4 F (36.9 C), temperature source Oral, resp. rate 18, height 5\' 7"  (1.702 m), weight 50.621 kg (111 lb 9.6 oz), SpO2 100 %.  Intake/Output Summary (Last 24 hours) at 11/10/15 1115 Last data filed at 11/10/15 0900  Gross per 24 hour  Intake    662 ml  Output   1150 ml  Net   -488 ml   Filed Weights   11/09/15 0600  Weight: 50.621 kg (111 lb 9.6 oz)    Exam Awake Alert, Oriented *3, No new F.N deficits, Normal affect [place].AT,PERRAL Supple Neck,No JVD, No cervical lymphadenopathy appriciated.  Symmetrical Chest wall movement, Good air movement bilaterally, CTAB RRR,No Gallops,Rubs or new Murmurs, No Parasternal Heave +ve B.Sounds, Abd Soft, Non tender, No organomegaly appriciated, No rebound -guarding or rigidity. No Cyanosis, Clubbing or edema, No new Rash or bruise  DISCHARGE CONDITION: Stable  DISPOSITION: Home  DISCHARGE INSTRUCTIONS:    Activity:  As tolerated   Get Medicines reviewed and adjusted: Please take all your medications with you for your next visit with your Primary MD  Please request your Primary MD  to go over all hospital tests and procedure/radiological results at the follow up, please ask your Primary MD to get all Hospital records sent to his/her office.  If you experience worsening of your admission symptoms, develop shortness of breath, life threatening emergency, suicidal or homicidal thoughts you must seek medical attention immediately by calling 911 or calling your MD immediately  if symptoms less severe.  You must read complete instructions/literature along with all the possible adverse reactions/side effects for all the Medicines you take and that have been prescribed to you. Take any new Medicines after you have completely understood and  accpet all the possible adverse reactions/side effects.   Do not drive when taking Pain medications.   Do not take more than prescribed Pain, Sleep and Anxiety Medications  Special Instructions: If you have smoked or chewed Tobacco  in the last 2 yrs please stop smoking, stop any regular Alcohol  and or any Recreational drug use.  Wear Seat belts while driving.  Please note  You were cared for by a hospitalist during your hospital stay. Once you are discharged, your primary care physician will handle any further medical issues. Please note that NO REFILLS for any discharge medications will be authorized once you are discharged, as it is imperative that you return to your primary care physician (or establish a relationship with a primary care physician if you do not have one) for your aftercare needs so that they can reassess your need for medications and monitor your lab values.   Diet recommendation: Regular Diet-but soft food/full liquids for 1 week   Discharge Instructions    Diet general    Complete by:  As directed   Stay on full liquids/soft food for 1 week     Discharge instructions    Complete by:  As directed   Your HIV test and Strep cultures are still pending-please have your Primary MD follow those results.     Increase activity slowly    Complete by:  As directed           Total Time spent on discharge equals 25 minutes.  SignedJeoffrey Massed 11/10/2015 11:15 AM

## 2015-11-12 LAB — CULTURE, GROUP A STREP (THRC)

## 2016-11-04 IMAGING — CT CT NECK W/ CM
1 of 4 series · 2 of 33 positions shown, 3 images · IV contrast (Iodine)
Comparison: None.

ADDENDUM:
Correction:

Lymph nodes: Lymphadenopathy, including LEFT level 2a 15 mm lymph
node, bilateral level IIa lymphadenopathy measuring up to 14 mm on
the RIGHT.
CLINICAL DATA: Acute onset sore throat for 1 week, shortness of
breath and difficulty swallowing. On penicillin.
EXAM:
CT NECK WITH CONTRAST
TECHNIQUE: Multidetector CT imaging of the neck was performed using the
standard protocol following the bolus administration of intravenous
contrast.
CONTRAST:  50mL OMNIPAQUE IOHEXOL 300 MG/ML  SOLN

[Series 207: (id) · axial · 0.49mm/px · z∈[+126,+158]mm · 2 of 48 slices shown, 3 images]
[im 16/48  soft-tissue]
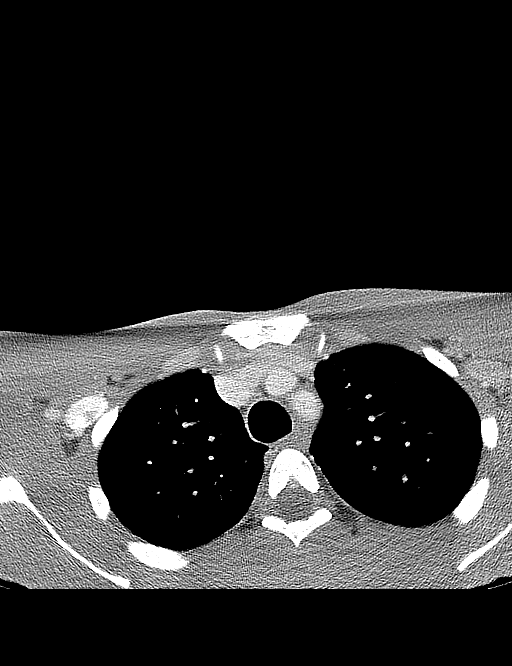
[im 16/48  bone]
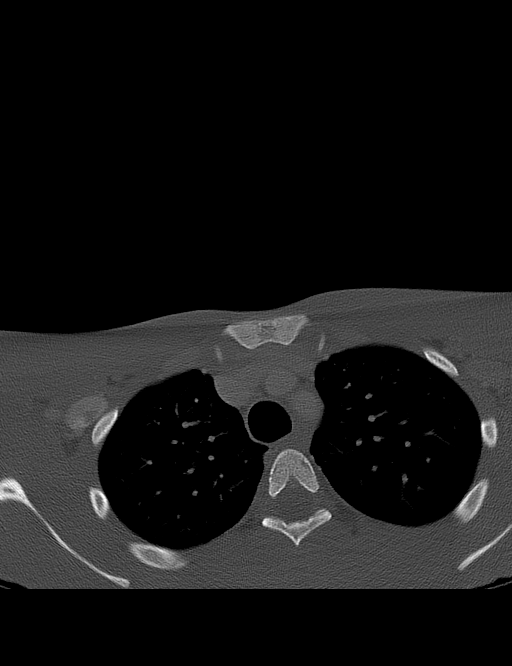
[im 32/48  bone]
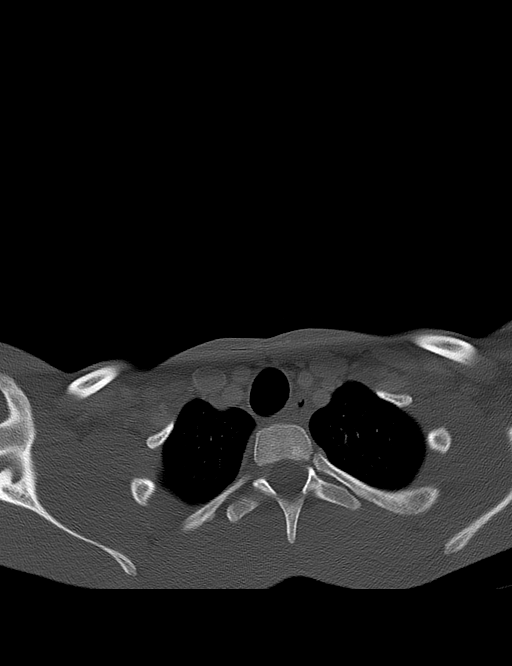

[2 of 33 positions shown; findings below may reference images not displayed]

FINDINGS: Pharynx and larynx: Markedly enlarged, symmetric edema of the
adenoidal soft tissues with faint peripheral enhancement. Otherwise
negative.

Salivary glands: Normal.

Thyroid: Normal.

Lymph nodes: Lymphadenopathy, including LEFT level 2 day 15 mm lymph
node, bilateral level IIa lymphadenopathy measuring up to 14 mm on
the RIGHT.

Vascular: Normal.

Limited intracranial: Normal.

Visualized orbits: Normal.

Mastoids and visualized paranasal sinuses: Minimal paranasal sinus
mucosal thickening without air-fluid levels. The mastoid air cells
are well aerated.

Skeleton: No acute osseous process or destructive bony lesions.

Upper chest: Lung apices are clear. No superior mediastinal
lymphadenopathy.
IMPRESSION: Severely edematous adenoids compatible with adenoiditis, suspected
superimposed abscess. Patent airway.

Lymphadenopathy is likely reactive.
